# Patient Record
Sex: Male | Born: 1980 | Race: White | Hispanic: No | Marital: Married | Smoking: Current every day smoker
Health system: Southern US, Community
[De-identification: ages and names within clinical notes are randomized; demographics above are authoritative.]

## PROBLEM LIST (undated history)

## (undated) HISTORY — PX: FACIAL COSMETIC SURGERY: SHX629

---
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
## 2001-09-02 ENCOUNTER — Ambulatory Visit (HOSPITAL_COMMUNITY): Admission: RE | Admit: 2001-09-02 | Discharge: 2001-09-02 | Payer: Self-pay

## 2002-04-28 ENCOUNTER — Emergency Department (HOSPITAL_COMMUNITY): Admission: EM | Admit: 2002-04-28 | Discharge: 2002-04-28 | Payer: Self-pay | Admitting: Emergency Medicine

## 2002-08-25 ENCOUNTER — Emergency Department (HOSPITAL_COMMUNITY): Admission: EM | Admit: 2002-08-25 | Discharge: 2002-08-25 | Payer: Self-pay | Admitting: Emergency Medicine

## 2005-09-04 ENCOUNTER — Emergency Department (HOSPITAL_COMMUNITY): Admission: EM | Admit: 2005-09-04 | Discharge: 2005-09-04 | Payer: Self-pay | Admitting: Emergency Medicine

## 2005-10-16 ENCOUNTER — Emergency Department (HOSPITAL_COMMUNITY): Admission: EM | Admit: 2005-10-16 | Discharge: 2005-10-16 | Payer: Self-pay | Admitting: *Deleted

## 2013-04-06 ENCOUNTER — Emergency Department (HOSPITAL_COMMUNITY)
Admission: EM | Admit: 2013-04-06 | Discharge: 2013-04-06 | Disposition: A | Discharge status: Home / Self Care | Payer: Self-pay | Attending: Emergency Medicine | Admitting: Emergency Medicine

## 2013-04-06 ENCOUNTER — Encounter (HOSPITAL_COMMUNITY): Payer: Self-pay | Admitting: *Deleted

## 2013-04-06 DIAGNOSIS — F17200 Nicotine dependence, unspecified, uncomplicated: Secondary | ICD-10-CM | POA: Insufficient documentation

## 2013-04-06 DIAGNOSIS — IMO0002 Reserved for concepts with insufficient information to code with codable children: Secondary | ICD-10-CM | POA: Insufficient documentation

## 2013-04-06 DIAGNOSIS — R21 Rash and other nonspecific skin eruption: Secondary | ICD-10-CM | POA: Insufficient documentation

## 2013-04-06 DIAGNOSIS — Z79899 Other long term (current) drug therapy: Secondary | ICD-10-CM | POA: Insufficient documentation

## 2013-04-06 LAB — CBC
MCH: 31.6 pg (ref 26.0–34.0)
MCV: 88.3 fL (ref 78.0–100.0)
Platelets: 256 10*3/uL (ref 150–400)
RBC: 4.78 MIL/uL (ref 4.22–5.81)

## 2013-04-06 LAB — RPR: RPR Ser Ql: NONREACTIVE

## 2013-04-06 NOTE — ED Notes (Signed)
Pt states he just got back from [place] from vacation on Sunday. Pt states since Sunday he has been having bil hand and feet swelling, burning, and pain.

## 2013-04-07 ENCOUNTER — Emergency Department (HOSPITAL_COMMUNITY)
Admission: EM | Admit: 2013-04-07 | Discharge: 2013-04-07 | Disposition: A | Discharge status: Home / Self Care | Payer: Self-pay | Attending: Emergency Medicine | Admitting: Emergency Medicine

## 2013-04-07 ENCOUNTER — Encounter (HOSPITAL_COMMUNITY): Payer: Self-pay | Admitting: *Deleted

## 2013-04-07 DIAGNOSIS — F17200 Nicotine dependence, unspecified, uncomplicated: Secondary | ICD-10-CM | POA: Insufficient documentation

## 2013-04-07 DIAGNOSIS — R21 Rash and other nonspecific skin eruption: Secondary | ICD-10-CM | POA: Insufficient documentation

## 2013-04-07 MED ORDER — PREDNISONE 20 MG PO TABS: ORAL_TABLET | ORAL | Status: DC

## 2013-04-07 MED ORDER — DOXYCYCLINE HYCLATE 100 MG PO CAPS: 100.0000 mg | ORAL_CAPSULE | Freq: Two times a day (BID) | ORAL | Status: DC

## 2013-04-07 MED ORDER — HYDROCODONE-ACETAMINOPHEN 5-325 MG PO TABS: 2.0000 | ORAL_TABLET | ORAL | Status: DC | PRN

## 2013-04-07 NOTE — ED Notes (Signed)
Pt c/o foot pain/palm pain x 2 days; states today burning; worse

## 2013-04-07 NOTE — ED Provider Notes (Signed)
CSN: [id number]     Arrival date & time 04/07/13  0058 History     First MD Initiated Contact with Patient 04/07/13 0116     Chief Complaint  Patient presents with  . Foot Pain   (Consider location/radiation/quality/duration/timing/severity/associated sxs/prior Treatment) HPI Comments: And presents to the ER for evaluation of continued foot and hand pain. Patient said yesterday for evaluation of painful rash on his palms and soles. Patient reports that the Benadryl is not helping. He thinks that his feet are more swollen. Still having trouble walking because of the pain. Patient still has not had a fever, but has been taking ibuprofen every 6-8 hours for the pain. No other cold or flu symptoms.  Patient is a 32 y.o. male presenting with lower extremity pain.  Foot Pain    History reviewed. No pertinent past medical history. Past Surgical History  Procedure Laterality Date  . Facial cosmetic surgery     No family history on file. History  Substance Use Topics  . Smoking status: Current Every Day Smoker    Types: Cigarettes  . Smokeless tobacco: Never Used  . Alcohol Use: Yes    Review of Systems  Constitutional: Negative for fever.  HENT: Negative for neck stiffness.   Skin: Positive for rash.  All other systems reviewed and are negative.    Allergies  Review of patient's allergies indicates no known allergies.  Home Medications   Current Outpatient Rx  Name  Route  Sig  Dispense  Refill  . loratadine (CLARITIN) 10 MG tablet   Oral   Take 10 mg by mouth as needed for allergies (allergies).          BP 157/82  Pulse 84  Temp(Src) 98.6 F (37 C)  Resp 20  SpO2 99% Physical Exam  Constitutional: He is oriented to person, place, and time. He appears well-developed and well-nourished. No distress.  HENT:  Head: Normocephalic and atraumatic.  Right Ear: Hearing normal.  Left Ear: Hearing normal.  Nose: Nose normal.  Mouth/Throat: Oropharynx is clear and  moist and mucous membranes are normal.  Eyes: Conjunctivae and EOM are normal. Pupils are equal, round, and reactive to light.  Neck: Normal range of motion. Neck supple.  Cardiovascular: Regular rhythm, S1 normal and S2 normal.  Exam reveals no gallop and no friction rub.   No murmur heard. Pulmonary/Chest: Effort normal and breath sounds normal. No respiratory distress. He exhibits no tenderness.  Abdominal: Soft. Normal appearance and bowel sounds are normal. There is no hepatosplenomegaly. There is no tenderness. There is no rebound, no guarding, no tenderness at McBurney's point and negative Murphy's sign. No hernia.  Musculoskeletal: Normal range of motion.  Neurological: He is alert and oriented to person, place, and time. He has normal strength. No cranial nerve deficit or sensory deficit. Coordination normal. GCS eye subscore is 4. GCS verbal subscore is 5. GCS motor subscore is 6.  Skin: Skin is warm, dry and intact. Rash (erythematous, nonraised, tender rash on palms and soles. Nonblanching, but not consistent with petechiae or purpura. No rash elsewhere on the body.) noted. No cyanosis.  Psychiatric: He has a normal mood and affect. His speech is normal and behavior is normal. Thought content normal.    ED Course   Procedures (including critical care time)  Labs Reviewed - No data to display No results found.  Diagnosis: Rash  MDM  Patient returns for evaluation of rash. He has not had any spreading of the rash, but  is complaining of continued pain. Rash is not petechial or purpuric.  It does appear to be consistent with hand-foot-mouth disease, unusual in adult population, but possible. Rash is not spreading and therefore documented by fever is unlikely. Patient does, however, report that he has had some recent tick bites this summer. Patient will be initiated on doxycycline for coverage of rickettsial disease as well as skin coverage for bacterial infection. Hydrocodone for  pain.  Gilda Crease, MD 04/07/13 [phone]

## 2013-04-07 NOTE — ED Notes (Signed)
MD at bedside. 

## 2013-04-16 NOTE — ED Provider Notes (Signed)
CSN: [id number]     Arrival date & time 04/06/13  0155 History   First MD Initiated Contact with Patient 04/06/13 0204     Chief Complaint  Patient presents with  . Hand Problem  . Foot Pain   (Consider location/radiation/quality/duration/timing/severity/associated sxs/prior Treatment) Patient is a 32 y.o. male presenting with lower extremity pain.  Foot Pain This is a new problem. Associated symptoms include a rash. Pertinent negatives include no chills, fever, headaches, nausea, neck pain, sore throat or vomiting. Associated symptoms comments: Painful rash to hands and feet that is described as burning type pain. No fever, rash other than on hands and feet, N, V or headache. .    History reviewed. No pertinent past medical history. Past Surgical History  Procedure Laterality Date  . Facial cosmetic surgery     History reviewed. No pertinent family history. History  Substance Use Topics  . Smoking status: Current Every Day Smoker    Types: Cigarettes  . Smokeless tobacco: Never Used  . Alcohol Use: Yes    Review of Systems  Constitutional: Negative for fever and chills.  HENT: Negative.  Negative for sore throat and neck pain.   Gastrointestinal: Negative.  Negative for nausea and vomiting.  Musculoskeletal:       See HPI.  Skin: Positive for rash.  Neurological: Negative.  Negative for headaches.    Allergies  Review of patient's allergies indicates no known allergies.  Home Medications   Current Outpatient Rx  Name  Route  Sig  Dispense  Refill  . loratadine (CLARITIN) 10 MG tablet   Oral   Take 10 mg by mouth as needed for allergies (allergies).         Marland Kitchen doxycycline (VIBRAMYCIN) 100 MG capsule   Oral   Take 1 capsule (100 mg total) by mouth 2 (two) times daily.   20 capsule   0   . HYDROcodone-acetaminophen (NORCO/VICODIN) 5-325 MG per tablet   Oral   Take 2 tablets by mouth every 4 (four) hours as needed for pain.   10 tablet   0   . predniSONE  (DELTASONE) 20 MG tablet      3 tabs po daily x 3 days, then 2 tabs x 3 days, then 1.5 tabs x 3 days, then 1 tab x 3 days, then 0.5 tabs x 3 days   27 tablet   0    BP 127/77  Pulse 89  Temp(Src) 99 F (37.2 C) (Oral)  Resp 18  Ht 5\' 10"  (1.778 m)  Wt 215 lb (97.523 kg)  BMI 30.85 kg/m2  SpO2 98% Physical Exam  Constitutional: He is oriented to person, place, and time. He appears well-developed and well-nourished.  Neck: Normal range of motion.  Pulmonary/Chest: Effort normal.  Musculoskeletal: Normal range of motion.  Neurological: He is alert and oriented to person, place, and time.  Skin: Skin is warm and dry.  Nonraised erythematous non-nodular areas that are mildly tender located on palms and soles. No noted swelling. FROM.  Psychiatric: He has a normal mood and affect.    ED Course  Procedures (including critical care time) Labs Review Labs Reviewed  RPR  CBC   Imaging Review No results found.  MDM   1. Rash and nonspecific skin eruption    No fever, vomiting or headache - doubt tick borne. Discussed with Dr. Blinda Leatherwood. Will treat with Benadryl and recommend PCP follow up.    Arnoldo Hooker, PA-C 04/16/13 2243

## 2013-04-19 NOTE — ED Provider Notes (Signed)
Medical screening examination/treatment/procedure(s) were conducted as a shared visit with non-physician practitioner(s) and myself.  I personally evaluated the patient during the encounter  PAtient with maculopaplar, non-specific rash on hands and feet, no fever. Etiology unknown, treat symptomatically.  Gilda Crease, MD 04/19/13 [phone]

## 2013-10-20 ENCOUNTER — Emergency Department (HOSPITAL_COMMUNITY)
Admission: EM | Admit: 2013-10-20 | Discharge: 2013-10-20 | Disposition: A | Discharge status: Home / Self Care | Payer: Self-pay | Attending: Emergency Medicine | Admitting: Emergency Medicine

## 2013-10-20 ENCOUNTER — Encounter (HOSPITAL_COMMUNITY): Payer: Self-pay | Admitting: Emergency Medicine

## 2013-10-20 ENCOUNTER — Emergency Department (HOSPITAL_COMMUNITY): Payer: Self-pay

## 2013-10-20 DIAGNOSIS — F17200 Nicotine dependence, unspecified, uncomplicated: Secondary | ICD-10-CM | POA: Insufficient documentation

## 2013-10-20 DIAGNOSIS — IMO0002 Reserved for concepts with insufficient information to code with codable children: Secondary | ICD-10-CM | POA: Insufficient documentation

## 2013-10-20 DIAGNOSIS — M779 Enthesopathy, unspecified: Secondary | ICD-10-CM

## 2013-10-20 DIAGNOSIS — M65849 Other synovitis and tenosynovitis, unspecified hand: Principal | ICD-10-CM

## 2013-10-20 DIAGNOSIS — Z79899 Other long term (current) drug therapy: Secondary | ICD-10-CM | POA: Insufficient documentation

## 2013-10-20 DIAGNOSIS — Z792 Long term (current) use of antibiotics: Secondary | ICD-10-CM | POA: Insufficient documentation

## 2013-10-20 DIAGNOSIS — M65839 Other synovitis and tenosynovitis, unspecified forearm: Secondary | ICD-10-CM | POA: Insufficient documentation

## 2013-10-20 MED ORDER — HYDROCODONE-ACETAMINOPHEN 5-325 MG PO TABS: 1.0000 | ORAL_TABLET | ORAL | Status: DC | PRN

## 2013-10-20 NOTE — ED Notes (Addendum)
Left wrist pain x 2 moths now all day pain cannot lift things w/ hand  States just woke up that way no injuryhas good pulse good blanch can wiggle fingers

## 2013-10-20 NOTE — ED Provider Notes (Signed)
Medical screening examination/treatment/procedure(s) were performed by non-physician practitioner and as supervising physician I was immediately available for consultation/collaboration.   EKG Interpretation None      Devoria AlbeIva Cartier Washko, MD, Armando GangFACEP   Ward GivensIva L Brei Pociask, MD 10/20/13 1550

## 2013-10-20 NOTE — ED Provider Notes (Signed)
CSN: [id number]     Arrival date & time 10/20/13  1353 History  This chart was scribed for non-physician practitioner Teressa LowerVrinda Kali Ambler, NP working with Ward GivensIva L Knapp, MD by Dorothey Basemania Sutton, ED Scribe. This patient was seen in room TR06C/TR06C and the patient's care was started at 2:37 PM.    Chief Complaint  Patient presents with  . Wrist Pain   The history is provided by the patient. No language interpreter was used.   HPI Comments: Colin Adkins is a 33 y.o. male who presents to the Emergency Department complaining of an intermittent pain to the left wrist that he states has been ongoing for the past 2 months, but has been progressively worsening to a constant pain over the last few days. He states that the pain is exacerbated with lifting heavy objects and movement. He denies any potential injury or trauma to the area. Patient reports wearing a brace on the area at night without significant relief. Patient states that he works in [employer] ([employer]). He denies history of prior injury to the area. Patient has no other pertinent medical history.   History reviewed. No pertinent past medical history. Past Surgical History  Procedure Laterality Date  . Facial cosmetic surgery     No family history on file. History  Substance Use Topics  . Smoking status: Current Every Day Smoker    Types: Cigarettes  . Smokeless tobacco: Never Used  . Alcohol Use: Yes    Review of Systems  Musculoskeletal: Positive for arthralgias.  All other systems reviewed and are negative.    Allergies  Review of patient's allergies indicates no known allergies.  Home Medications   Current Outpatient Rx  Name  Route  Sig  Dispense  Refill  . doxycycline (VIBRAMYCIN) 100 MG capsule   Oral   Take 1 capsule (100 mg total) by mouth 2 (two) times daily.   20 capsule   0   . HYDROcodone-acetaminophen (NORCO/VICODIN) 5-325 MG per tablet   Oral   Take 2 tablets by mouth every 4 (four) hours as needed for pain.  10 tablet   0   . loratadine (CLARITIN) 10 MG tablet   Oral   Take 10 mg by mouth as needed for allergies (allergies).         . predniSONE (DELTASONE) 20 MG tablet      3 tabs po daily x 3 days, then 2 tabs x 3 days, then 1.5 tabs x 3 days, then 1 tab x 3 days, then 0.5 tabs x 3 days   27 tablet   0    Triage Vitals: BP 134/97  Pulse 99  Temp(Src) 98.2 F (36.8 C) (Oral)  Resp 16  SpO2 99%  Physical Exam  Nursing note and vitals reviewed. Constitutional: He is oriented to person, place, and time. He appears well-developed and well-nourished. No distress.  HENT:  Head: Normocephalic and atraumatic.  Eyes: Conjunctivae are normal.  Neck: Normal range of motion. Neck supple.  Cardiovascular: Normal rate, regular rhythm and normal heart sounds.   Pulmonary/Chest: Effort normal and breath sounds normal. No respiratory distress.  Abdominal: He exhibits no distension.  Musculoskeletal: Normal range of motion.  Tenderness to palpation to the medial aspect of the left wrist. Full range of motion.   Neurological: He is alert and oriented to person, place, and time.  Skin: Skin is warm and dry.  Psychiatric: He has a normal mood and affect. His behavior is normal.    ED  Course  Procedures (including critical care time)  DIAGNOSTIC STUDIES: Oxygen Saturation is 99% on room air, normal by my interpretation.    COORDINATION OF CARE: 2:40 PM- Ordered an x-ray of the left wrist. Discussed treatment plan with patient at bedside and patient verbalized agreement.     Labs Review Labs Reviewed - No data to display  Imaging Review Dg Wrist Complete Left  10/20/2013   CLINICAL DATA:  Lateral left wrist pain  EXAM: LEFT WRIST - COMPLETE 3+ VIEW  COMPARISON:  None.  FINDINGS: There is no evidence of fracture or dislocation. There is no evidence of arthropathy or other focal bone abnormality. Soft tissues are unremarkable.  IMPRESSION: Negative.   Electronically Signed   By: Elige Ko   On: 10/20/2013 15:21     EKG Interpretation None      MDM   Final diagnoses:  Tendonitis    Pt has a brace at home and he is instructed on use. Pt okay to follow up with hand for continued syptoms  I personally performed the services described in this documentation, which was scribed in my presence. The recorded information has been reviewed and is accurate.     Teressa Lower, NP 10/20/13 1538

## 2013-10-20 NOTE — Discharge Instructions (Signed)
As discussed you need to where your brace. Tendinitis Tendinitis is swelling and inflammation of the tendons. Tendons are band-like tissues that connect muscle to bone. Tendinitis commonly occurs in the:   Shoulders (rotator cuff).  Heels (Achilles tendon).  Elbows (triceps tendon). CAUSES Tendinitis is usually caused by overusing the tendon, muscles, and joints involved. When the tissue surrounding a tendon (synovium) becomes inflamed, it is called tenosynovitis. Tendinitis commonly develops in people whose jobs require repetitive motions. SYMPTOMS  Pain.  Tenderness.  Mild swelling. DIAGNOSIS Tendinitis is usually diagnosed by physical exam. Your caregiver may also order X-rays or other imaging tests. TREATMENT Your caregiver may recommend certain medicines or exercises for your treatment. HOME CARE INSTRUCTIONS   Use a sling or splint for as long as directed by your caregiver until the pain decreases.  Put ice on the injured area.  Put ice in a plastic bag.  Place a towel between your skin and the bag.  Leave the ice on for 15-20 minutes, 03-04 times a day.  Avoid using the limb while the tendon is painful. Perform gentle range of motion exercises only as directed by your caregiver. Stop exercises if pain or discomfort increase, unless directed otherwise by your caregiver.  Only take over-the-counter or prescription medicines for pain, discomfort, or fever as directed by your caregiver. SEEK MEDICAL CARE IF:   Your pain and swelling increase.  You develop new, unexplained symptoms, especially increased numbness in the hands. MAKE SURE YOU:   Understand these instructions.  Will watch your condition.  Will get help right away if you are not doing well or get worse. Document Released: 08/02/2000 Document Revised: 10/28/2011 Document Reviewed: 10/22/2010 [facility] Patient Information 2014 [place] [employer], [place].

## 2015-06-13 IMAGING — CR DG WRIST COMPLETE 3+V*L*
4 series · 4 of 4 positions shown · non-contrast
Comparison: None.

CLINICAL DATA: Lateral left wrist pain

EXAM:
LEFT WRIST - COMPLETE 3+ VIEW

[x wrist pa left]
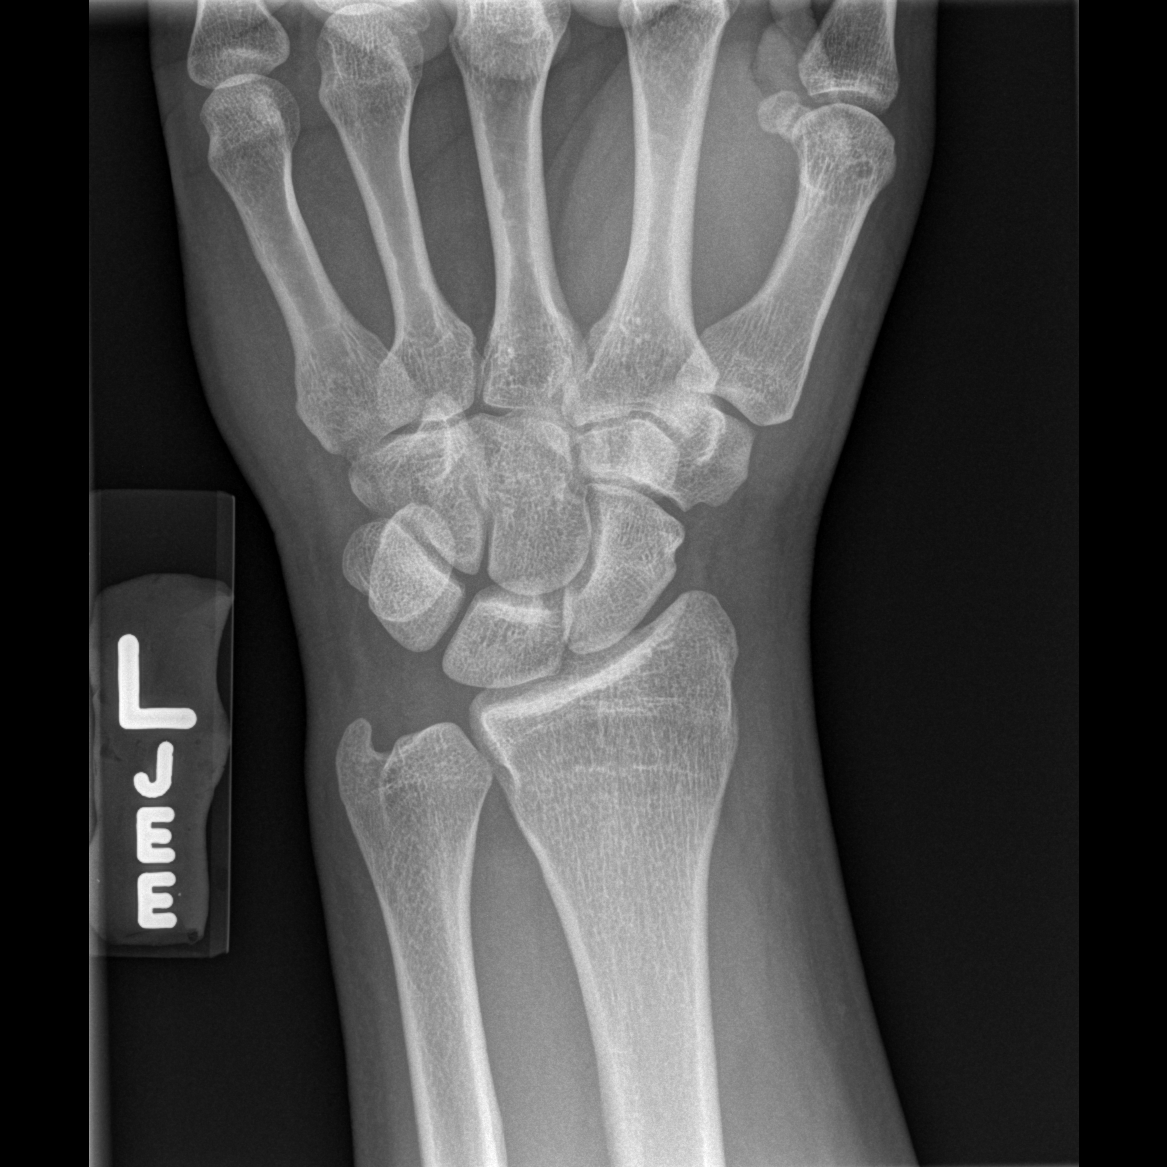

[x wrist obl left]
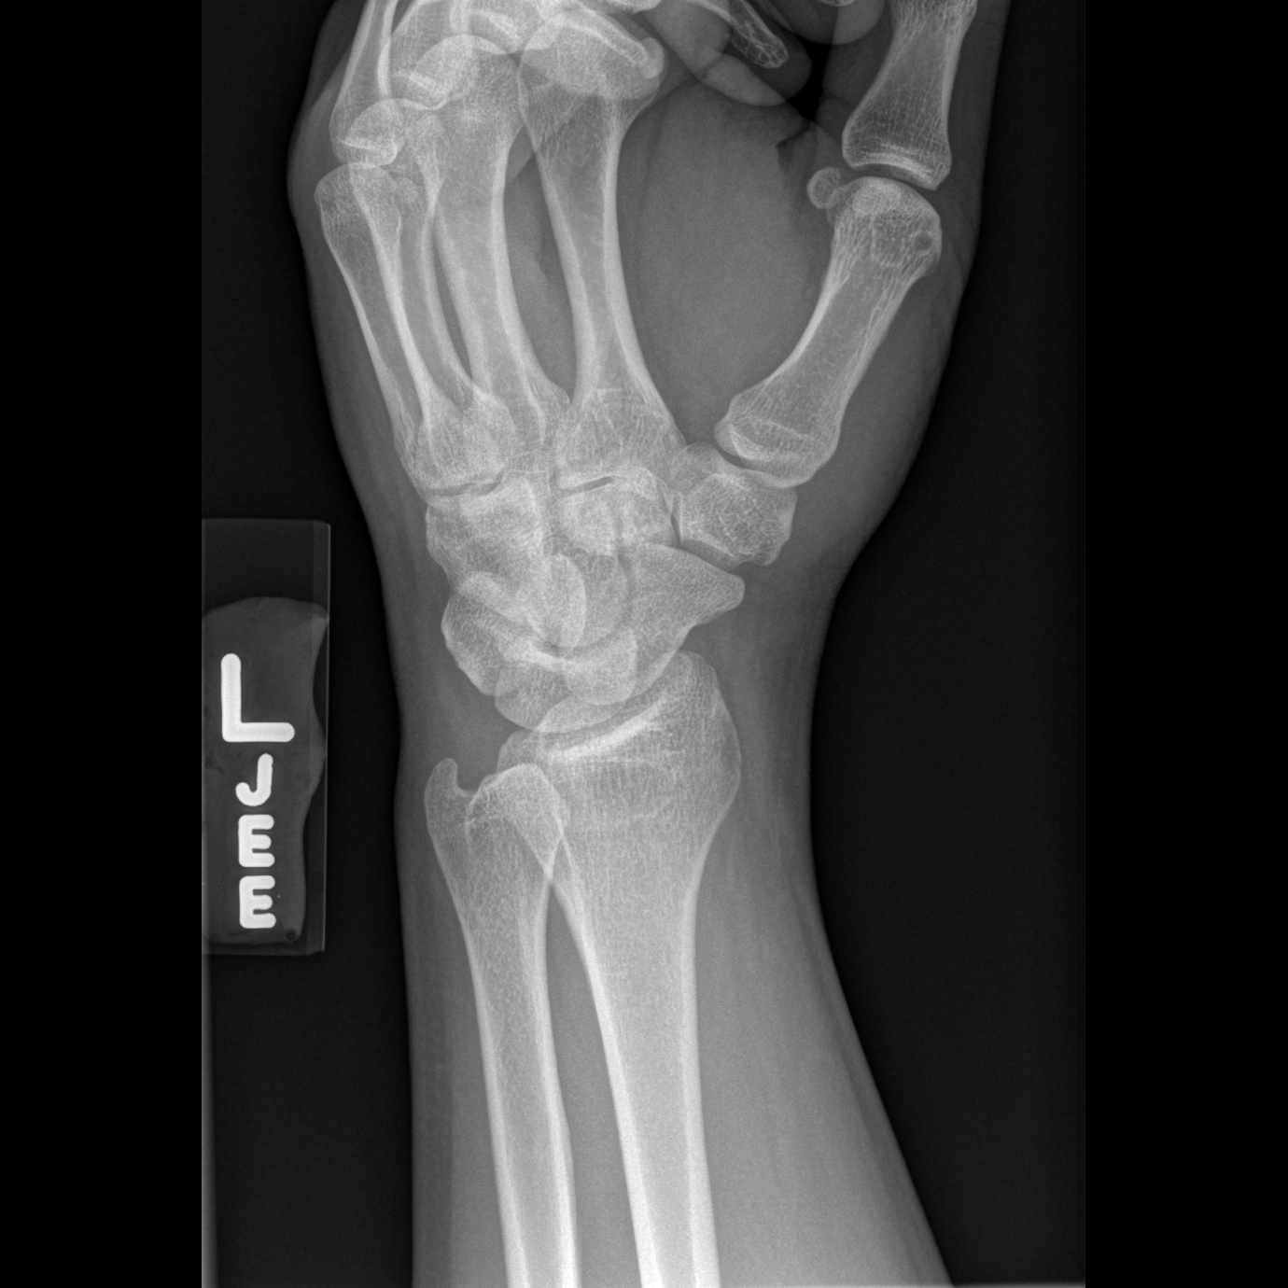

[x wrist lat left]
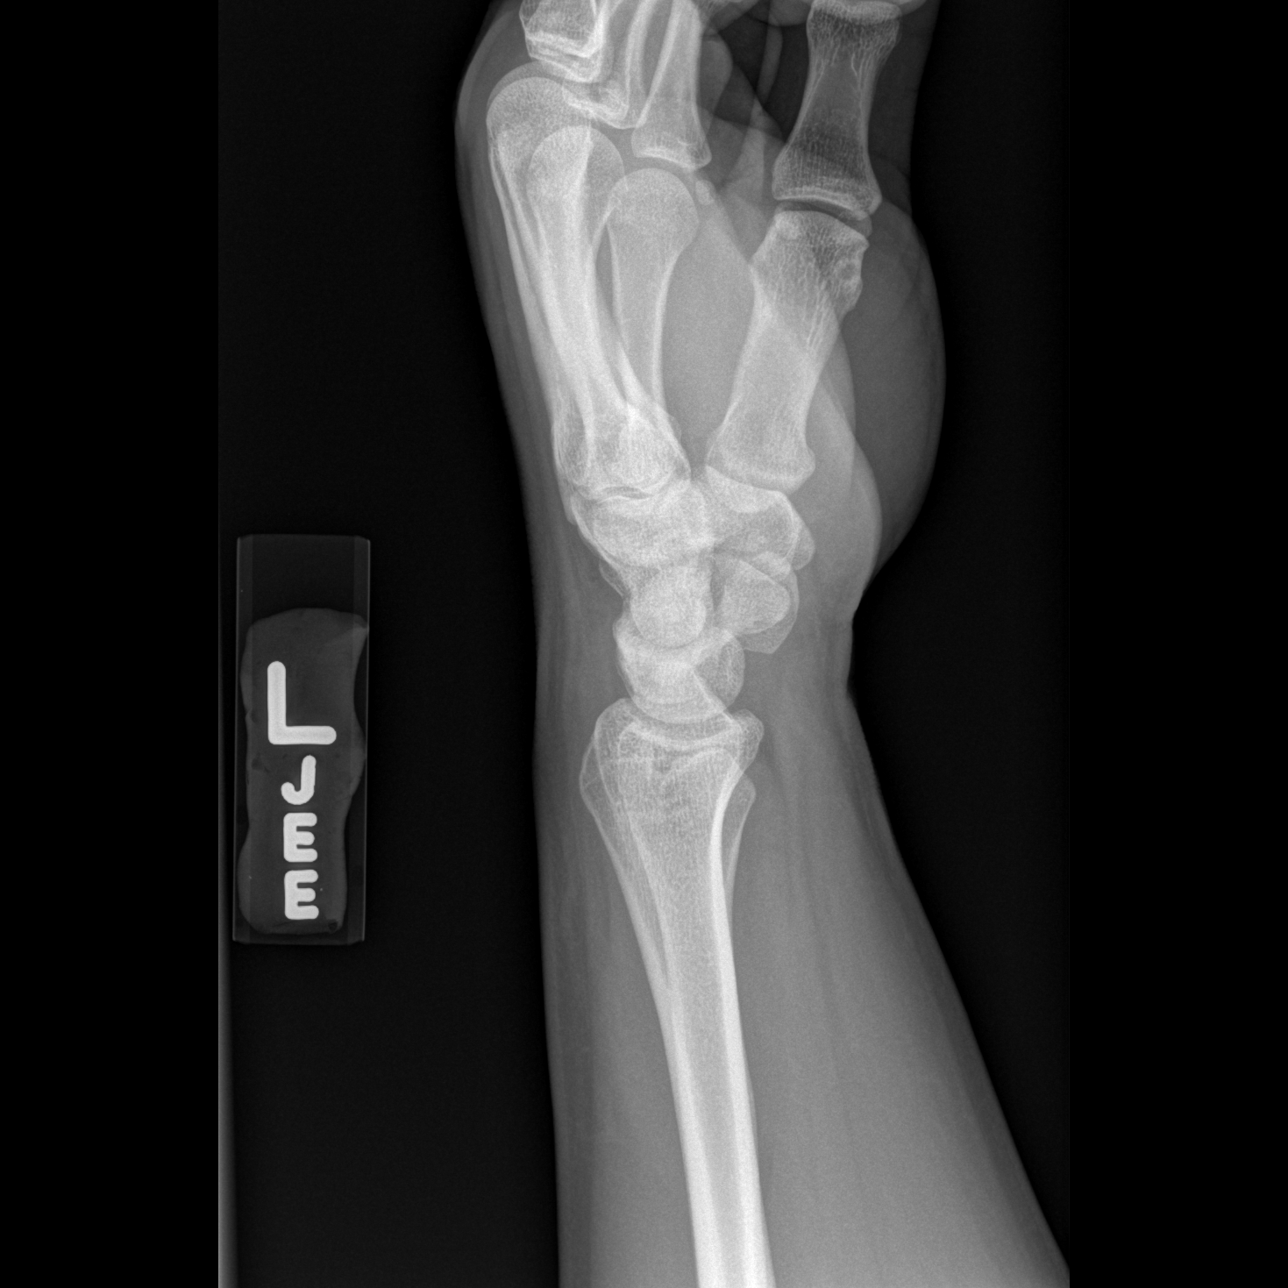

[x navicular]
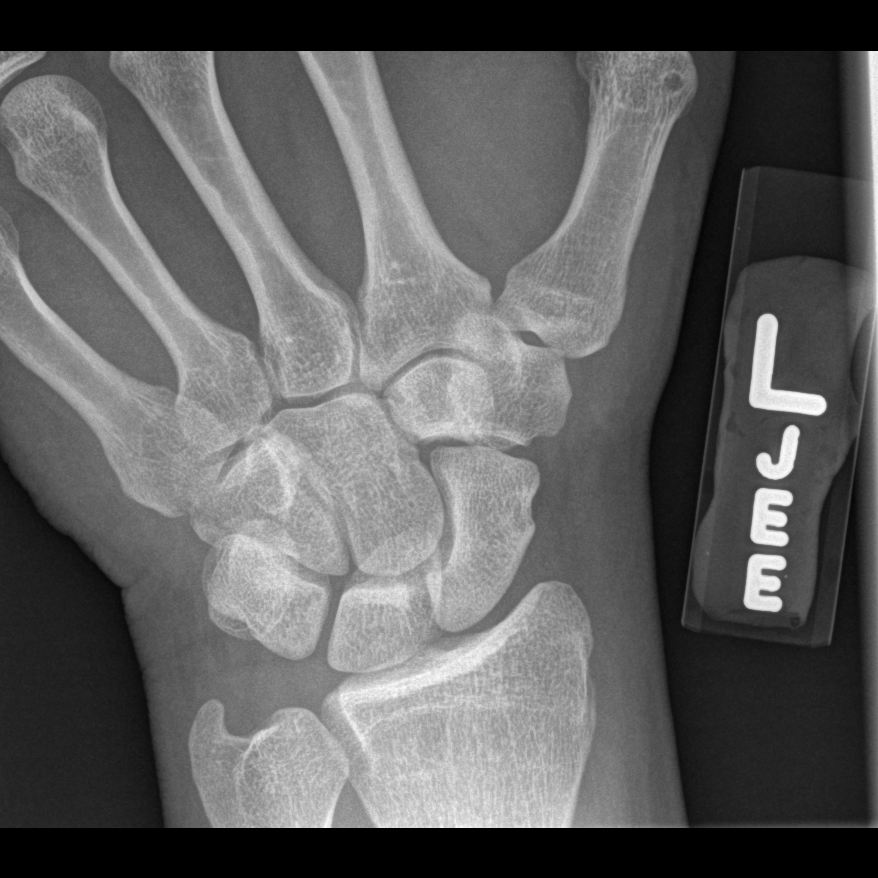

[4 of 4 positions shown; findings below may reference images not displayed]

FINDINGS: There is no evidence of fracture or dislocation. There is no
evidence of arthropathy or other focal bone abnormality. Soft
tissues are unremarkable.
IMPRESSION: Negative.

## 2015-06-27 ENCOUNTER — Ambulatory Visit (INDEPENDENT_AMBULATORY_CARE_PROVIDER_SITE_OTHER): Payer: Self-pay | Admitting: Physician Assistant

## 2015-06-27 VITALS — BP 136/80 | HR 85 | Temp 98.2°F | Resp 18 | Ht 71.0 in | Wt 219.2 lb

## 2015-06-27 DIAGNOSIS — J069 Acute upper respiratory infection, unspecified: Secondary | ICD-10-CM

## 2015-06-27 MED ORDER — IPRATROPIUM BROMIDE 0.03 % NA SOLN
2.0000 | Freq: Two times a day (BID) | NASAL | Status: DC
Start: 2015-06-27 — End: 2022-10-02

## 2015-06-27 MED ORDER — BENZONATATE 100 MG PO CAPS: 100.0000 mg | ORAL_CAPSULE | Freq: Three times a day (TID) | ORAL | Status: DC | PRN

## 2015-06-27 NOTE — Patient Instructions (Signed)
Upper Respiratory Infection, Adult Most upper respiratory infections (URIs) are a viral infection of the air passages leading to the lungs. A URI affects the nose, throat, and upper air passages. The most common type of URI is nasopharyngitis and is typically referred to as "the common cold." URIs run their course and usually go away on their own. Most of the time, a URI does not require medical attention, but sometimes a bacterial infection in the upper airways can follow a viral infection. This is called a secondary infection. Sinus and middle ear infections are common types of secondary upper respiratory infections. Bacterial pneumonia can also complicate a URI. A URI can worsen asthma and chronic obstructive pulmonary disease (COPD). Sometimes, these complications can require emergency medical care and may be life threatening.  CAUSES Almost all URIs are caused by viruses. A virus is a type of germ and can spread from one person to another.  RISKS FACTORS You may be at risk for a URI if:   You smoke.   You have chronic heart or lung disease.  You have a weakened defense (immune) system.   You are very young or very old.   You have nasal allergies or asthma.  You work in crowded or poorly ventilated areas.  You work in health care facilities or schools. SIGNS AND SYMPTOMS  Symptoms typically develop 2-3 days after you come in contact with a cold virus. Most viral URIs last 7-10 days. However, viral URIs from the influenza virus (flu virus) can last 14-18 days and are typically more severe. Symptoms may include:   Runny or stuffy (congested) nose.   Sneezing.   Cough.   Sore throat.   Headache.   Fatigue.   Fever.   Loss of appetite.   Pain in your forehead, behind your eyes, and over your cheekbones (sinus pain).  Muscle aches.  DIAGNOSIS  Your health care provider may diagnose a URI by:  Physical exam.  Tests to check that your symptoms are not due to  another condition such as:  Strep throat.  Sinusitis.  Pneumonia.  Asthma. TREATMENT  A URI goes away on its own with time. It cannot be cured with medicines, but medicines may be prescribed or recommended to relieve symptoms. Medicines may help:  Reduce your fever.  Reduce your cough.  Relieve nasal congestion. HOME CARE INSTRUCTIONS   Take medicines only as directed by your health care provider.   Gargle warm saltwater or take cough drops to comfort your throat as directed by your health care provider.  Use a warm mist humidifier or inhale steam from a shower to increase air moisture. This may make it easier to breathe.  Drink enough fluid to keep your urine clear or pale yellow.   Eat soups and other clear broths and maintain good nutrition.   Rest as needed.   Return to work when your temperature has returned to normal or as your health care provider advises. You may need to stay home longer to avoid infecting others. You can also use a face mask and careful hand washing to prevent spread of the virus.  Increase the usage of your inhaler if you have asthma.   Do not use any tobacco products, including cigarettes, chewing tobacco, or electronic cigarettes. If you need help quitting, ask your health care provider. PREVENTION  The best way to protect yourself from getting a cold is to practice good hygiene.   Avoid oral or hand contact with people with cold   symptoms.   Wash your hands often if contact occurs.  There is no clear evidence that vitamin C, vitamin E, echinacea, or exercise reduces the chance of developing a cold. However, it is always recommended to get plenty of rest, exercise, and practice good nutrition.  SEEK MEDICAL CARE IF:   You are getting worse rather than better.   Your symptoms are not controlled by medicine.   You have chills.  You have worsening shortness of breath.  You have brown or red mucus.  You have yellow or brown nasal  discharge.  You have pain in your face, especially when you bend forward.  You have a fever.  You have swollen neck glands.  You have pain while swallowing.  You have white areas in the back of your throat. SEEK IMMEDIATE MEDICAL CARE IF:   You have severe or persistent:  Headache.  Ear pain.  Sinus pain.  Chest pain.  You have chronic lung disease and any of the following:  Wheezing.  Prolonged cough.  Coughing up blood.  A change in your usual mucus.  You have a stiff neck.  You have changes in your:  Vision.  Hearing.  Thinking.  Mood. MAKE SURE YOU:   Understand these instructions.  Will watch your condition.  Will get help right away if you are not doing well or get worse.   This information is not intended to replace advice given to you by your health care provider. Make sure you discuss any questions you have with your health care provider.   Document Released: 01/29/2001 Document Revised: 12/20/2014 Document Reviewed: 11/10/2013 Elsevier Interactive Patient Education 2016 [employer]  

## 2015-06-27 NOTE — Progress Notes (Signed)
   Subjective:    Patient ID: Colin Adkins, male    DOB: Aug 14, 1981, 34 y.o.   MRN: [id number]  HPI Patient presents for cough and congestion that have been present for the past 6 days. Has been smoking for the past 15 years and always has a cough which is bad in the morning, however, this morning a little worse and saw blood when he coughed. Admits that had a vessel in right nares that has to be cauterized often, but has not had done recently. Endorses rhinorrhea, HA, and some sinus pressure. Denies fever, N/V, SOB, wheezing, CP, post nasal drip. Had taken ibuprofen with some relief. No know sick contacts. NKDA.   Review of Systems As noted above.    Objective:   Physical Exam  Constitutional: He is oriented to person, place, and time. He appears well-developed and well-nourished. No distress.  Blood pressure 136/80, pulse 85, temperature 98.2 F (36.8 C), temperature source Oral, resp. rate 18, height 5\' 11"  (1.803 m), weight 219 lb 3.2 oz (99.428 kg), SpO2 98 %.   HENT:  Head: Normocephalic and atraumatic.  Right Ear: Tympanic membrane, external ear and ear canal normal.  Left Ear: Tympanic membrane, external ear and ear canal normal.  Nose: Rhinorrhea (with erythema) and nasal deformity present. No nose lacerations, sinus tenderness or nasal septal hematoma. No epistaxis.  No foreign bodies. Right sinus exhibits no maxillary sinus tenderness and no frontal sinus tenderness. Left sinus exhibits no maxillary sinus tenderness and no frontal sinus tenderness.  Mouth/Throat: Uvula is midline, oropharynx is clear and moist and mucous membranes are normal. No oropharyngeal exudate, posterior oropharyngeal edema or posterior oropharyngeal erythema.  Dried blood in left nares, opposite side that usually affects patient  Eyes: Conjunctivae are normal. Pupils are equal, round, and reactive to light. Right eye exhibits no discharge. Left eye exhibits no discharge. No scleral icterus.  Neck: Normal  range of motion. Neck supple. No thyromegaly present.  Cardiovascular: Normal rate, regular rhythm and normal heart sounds.  Exam reveals no gallop and no friction rub.   No murmur heard. Pulmonary/Chest: Effort normal and breath sounds normal. No respiratory distress. He has no decreased breath sounds. He has no wheezes. He has no rhonchi. He has no rales.  Abdominal: Soft. Bowel sounds are normal. He exhibits no distension. There is no tenderness. There is no rebound and no guarding.  Lymphadenopathy:    He has no cervical adenopathy.  Neurological: He is alert and oriented to person, place, and time.  Skin: Skin is warm and dry. No rash noted. He is not diaphoretic. No erythema.       Assessment & Plan:  1. Acute upper respiratory infection Plenty of fluid.  - ipratropium (ATROVENT) 0.03 % nasal spray; Place 2 sprays into both nostrils 2 (two) times daily.  Dispense: 30 mL; Refill: 0 - benzonatate (TESSALON) 100 MG capsule; Take 1-2 capsules (100-200 mg total) by mouth 3 (three) times daily as needed for cough.  Dispense: 40 capsule; Refill: 0   Ranada Vigorito PA-C  [facility] 06/27/2015 2:48 PM

## 2018-04-09 ENCOUNTER — Other Ambulatory Visit: Payer: Self-pay

## 2018-04-09 ENCOUNTER — Encounter (HOSPITAL_COMMUNITY): Payer: Self-pay | Admitting: Emergency Medicine

## 2018-04-09 ENCOUNTER — Emergency Department (HOSPITAL_COMMUNITY)
Admission: EM | Admit: 2018-04-09 | Discharge: 2018-04-10 | Disposition: A | Discharge status: Home / Self Care | Payer: Medicaid Other | Attending: Emergency Medicine | Admitting: Emergency Medicine

## 2018-04-09 DIAGNOSIS — M79602 Pain in left arm: Secondary | ICD-10-CM | POA: Diagnosis present

## 2018-04-09 DIAGNOSIS — F17210 Nicotine dependence, cigarettes, uncomplicated: Secondary | ICD-10-CM | POA: Insufficient documentation

## 2018-04-09 DIAGNOSIS — Z79899 Other long term (current) drug therapy: Secondary | ICD-10-CM | POA: Insufficient documentation

## 2018-04-09 DIAGNOSIS — G5622 Lesion of ulnar nerve, left upper limb: Secondary | ICD-10-CM

## 2018-04-09 MED ORDER — PREDNISONE 20 MG PO TABS
60.0000 mg | ORAL_TABLET | Freq: Once | ORAL | Status: AC
  Administered 2018-04-10: 60 mg via ORAL
  Filled 2018-04-09: qty 3

## 2018-04-09 NOTE — ED Triage Notes (Signed)
Pt states for the past 2.5-3 weeks he has had numbness and tingling in his left hand in the 4th and 5th fingers  Pt states over the past few days that feeling has started going up his arm and now he has a dull achy pain in his left elbow

## 2018-04-10 MED ORDER — PREDNISONE 20 MG PO TABS: ORAL_TABLET | ORAL | 0 refills | Status: DC

## 2018-04-10 NOTE — Discharge Instructions (Signed)
As we talked about today, you likely have some compression/entrapment of your ulnar nerve causing your symptoms. Take the prescribed medication as directed. Follow-up with hand surgery-- call Dr. Debby Budoley's office in the morning to make appt. Return to the ED for new or worsening symptoms.

## 2018-04-10 NOTE — ED Provider Notes (Signed)
[facility]-EMERGENCY DEPT Provider Note   CSN: [id number] Arrival date & time: 04/09/18  2223     History   Chief Complaint Chief Complaint  Patient presents with  . Arm Pain    HPI NYCHOLAS Adkins is a 37 y.o. male.  The history is provided by the patient and medical records.    37 year old male here with tingling in his left fourth and fifth fingers.  Reports this is been ongoing for about 2 weeks.  Reports over the past few days he has noticed some pain and sensation of tension in the inside of his left elbow.  This is not had any injury, trauma, or falls.  States he does not do a lot of manual labor and does not do repetitive motions during the day.  States elbow just feels a little "sore".  He has not had any difficulty with strength in his left arm and has not noticed any weakness.  Is able to grip and pick up items as normal.  He is right-hand dominant.  History reviewed. No pertinent past medical history.  There are no active problems to display for this patient.   Past Surgical History:  Procedure Laterality Date  . FACIAL COSMETIC SURGERY          Home Medications    Prior to Admission medications   Medication Sig Start Date End Date Taking? Authorizing Provider  benzonatate (TESSALON) 100 MG capsule Take 1-2 capsules (100-200 mg total) by mouth 3 (three) times daily as needed for cough. 06/27/15   Brewington, Tishira R, PA-C  doxycycline (VIBRAMYCIN) 100 MG capsule Take 1 capsule (100 mg total) by mouth 2 (two) times daily. Patient not taking: Reported on 06/27/2015 04/07/13   Gilda Crease, MD  HYDROcodone-acetaminophen (NORCO/VICODIN) 5-325 MG per tablet Take 2 tablets by mouth every 4 (four) hours as needed for pain. Patient not taking: Reported on 06/27/2015 04/07/13   Gilda Crease, MD  HYDROcodone-acetaminophen (NORCO/VICODIN) 5-325 MG per tablet Take 1-2 tablets by mouth every 4 (four) hours as needed. Patient not taking:  Reported on 06/27/2015 10/20/13   Teressa Lower, NP  ipratropium (ATROVENT) 0.03 % nasal spray Place 2 sprays into both nostrils 2 (two) times daily. 06/27/15   Brewington, Tishira R, PA-C  loratadine (CLARITIN) 10 MG tablet Take 10 mg by mouth as needed for allergies (allergies).    [provider]  predniSONE (DELTASONE) 20 MG tablet 3 tabs po daily x 3 days, then 2 tabs x 3 days, then 1.5 tabs x 3 days, then 1 tab x 3 days, then 0.5 tabs x 3 days Patient not taking: Reported on 06/27/2015 04/07/13   Gilda Crease, MD    Family History History reviewed. No pertinent family history.  Social History Social History   Tobacco Use  . Smoking status: Current Every Day Smoker    Types: Cigarettes  . Smokeless tobacco: Never Used  Substance Use Topics  . Alcohol use: Yes  . Drug use: No     Allergies   Patient has no known allergies.   Review of Systems Review of Systems  Musculoskeletal: Positive for arthralgias.  All other systems reviewed and are negative.    Physical Exam Updated Vital Signs BP (!) 156/99 (BP Location: Right Arm)   Pulse 78   Resp 15   Ht 5\' 11"  (1.803 m)   Wt 99.8 kg   SpO2 98%   BMI 30.68 kg/m   Physical Exam  Constitutional: He  is oriented to person, place, and time. He appears well-developed and well-nourished.  HENT:  Head: Normocephalic and atraumatic.  Mouth/Throat: Oropharynx is clear and moist.  Eyes: Pupils are equal, round, and reactive to light. Conjunctivae and EOM are normal.  Neck: Normal range of motion.  Cardiovascular: Normal rate, regular rhythm and normal heart sounds.  Pulmonary/Chest: Effort normal and breath sounds normal.  Abdominal: Soft. Bowel sounds are normal.  Musculoskeletal: Normal range of motion.  Left arms including elbow, wrist, and fingers all normal in appearance without visible swelling or deformity; with compression of medial epicondyle of left elbow increased paresthesias extending down the  arm; distal sensation of the left 4th and 5th fingers are diminished compared to rest of the hand, maintains normal grip strength, normal radial pulse and cap refill  Neurological: He is alert and oriented to person, place, and time.  Skin: Skin is warm and dry.  Psychiatric: He has a normal mood and affect.  Nursing note and vitals reviewed.    ED Treatments / Results  Labs (all labs ordered are listed, but only abnormal results are displayed) Labs Reviewed - No data to display  EKG None  Radiology No results found.  Procedures Procedures (including critical care time)  Medications Ordered in ED Medications  predniSONE (DELTASONE) tablet 60 mg (60 mg Oral Given 04/10/18 0026)     Initial Impression / Assessment and Plan / ED Course  I have reviewed the triage vital signs and the nursing notes.  Pertinent labs & imaging results that were available during my care of the patient were reviewed by me and considered in my medical decision making (see chart for details).  37 year old male here with tingling in his left fourth and fifth digits as well as some pain along the medial left elbow.  Exam findings as above.  He has no focal strength deficit maintains normal range of motion.  Sensation is somewhat diminished in the distal left fourth and fifth fingertips and palpation along the medial epicondyles makes this worse.  Patient likely with ulnar nerve compression/entrapment.  He does not have any focal deficit to suggest central cause such as CVA, TIA, or other central neurologic etiology.  Will start on steroids and refer to hand surgery for follow-up.  Discussed plan with patient, he acknowledged understanding and agreed with plan of care.  Return precautions given for new or worsening symptoms.  Final Clinical Impressions(s) / ED Diagnoses   Final diagnoses:  Ulnar nerve compression, left    ED Discharge Orders         Ordered    predniSONE (DELTASONE) 20 MG tablet      04/10/18 0003           Garlon HatchetSanders, Bayden Gil M, PA-C 04/10/18 0143    Molpus, Jonny RuizJohn, MD 04/10/18 [id number]

## 2018-05-05 ENCOUNTER — Encounter (HOSPITAL_COMMUNITY): Payer: Self-pay

## 2018-05-05 ENCOUNTER — Emergency Department (HOSPITAL_COMMUNITY)
Admission: EM | Admit: 2018-05-05 | Discharge: 2018-05-06 | Disposition: A | Discharge status: Home / Self Care | Payer: Medicaid Other | Attending: Emergency Medicine | Admitting: Emergency Medicine

## 2018-05-05 DIAGNOSIS — Z79899 Other long term (current) drug therapy: Secondary | ICD-10-CM | POA: Insufficient documentation

## 2018-05-05 DIAGNOSIS — F17210 Nicotine dependence, cigarettes, uncomplicated: Secondary | ICD-10-CM | POA: Diagnosis not present

## 2018-05-05 DIAGNOSIS — H5789 Other specified disorders of eye and adnexa: Secondary | ICD-10-CM | POA: Diagnosis present

## 2018-05-05 DIAGNOSIS — H1033 Unspecified acute conjunctivitis, bilateral: Secondary | ICD-10-CM | POA: Diagnosis not present

## 2018-05-05 NOTE — ED Triage Notes (Signed)
Pt complains of bilateral eye irritation since Friday, he was seen and treated for pink eye with no relief

## 2018-05-06 MED ORDER — ARTIFICIAL TEARS OPHTHALMIC OINT
TOPICAL_OINTMENT | Freq: Once | OPHTHALMIC | Status: AC
  Administered 2018-05-06: 04:00:00 via OPHTHALMIC
  Filled 2018-05-06: qty 3.5

## 2018-05-06 NOTE — ED Provider Notes (Signed)
[facility]-EMERGENCY DEPT Provider Note   CSN: [id number] Arrival date & time: 05/05/18  2234     History   Chief Complaint Chief Complaint  Patient presents with  . eye irritation    HPI Colin Adkins is a 37 y.o. male.  HPI   Colin Fewravis A Skarda is a 37 y.o. male, patient with no pertinent past medical history, presenting to the ED with eye irritation for the last 4-5 days. Woke up with irritation, redness, and clear drainage from the left eye, progressed to include right eye. Was seen at urgent care on 9/15, prescribed gentamycin drops, voices compliance.  Denies vision loss, fever, facial swelling, trauma, or any other complaints.  History reviewed. No pertinent past medical history.  There are no active problems to display for this patient.   Past Surgical History:  Procedure Laterality Date  . FACIAL COSMETIC SURGERY          Home Medications    Prior to Admission medications   Medication Sig Start Date End Date Taking? Authorizing Provider  benzonatate (TESSALON) 100 MG capsule Take 1-2 capsules (100-200 mg total) by mouth 3 (three) times daily as needed for cough. 06/27/15   Brewington, Tishira R, PA-C  doxycycline (VIBRAMYCIN) 100 MG capsule Take 1 capsule (100 mg total) by mouth 2 (two) times daily. Patient not taking: Reported on 06/27/2015 04/07/13   Gilda CreasePollina, Christopher J, MD  HYDROcodone-acetaminophen (NORCO/VICODIN) 5-325 MG per tablet Take 2 tablets by mouth every 4 (four) hours as needed for pain. Patient not taking: Reported on 06/27/2015 04/07/13   Gilda CreasePollina, Christopher J, MD  HYDROcodone-acetaminophen (NORCO/VICODIN) 5-325 MG per tablet Take 1-2 tablets by mouth every 4 (four) hours as needed. Patient not taking: Reported on 06/27/2015 10/20/13   Teressa LowerPickering, Vrinda, NP  ipratropium (ATROVENT) 0.03 % nasal spray Place 2 sprays into both nostrils 2 (two) times daily. 06/27/15   Brewington, Tishira R, PA-C  loratadine (CLARITIN) 10 MG tablet Take  10 mg by mouth as needed for allergies (allergies).    [provider]  predniSONE (DELTASONE) 20 MG tablet Take 40 mg by mouth daily for 3 days, then 20mg  by mouth daily for 3 days, then 10mg  daily for 3 days 04/10/18   Garlon HatchetSanders, Lisa M, PA-C    Family History History reviewed. No pertinent family history.  Social History Social History   Tobacco Use  . Smoking status: Current Every Day Smoker    Types: Cigarettes  . Smokeless tobacco: Never Used  Substance Use Topics  . Alcohol use: Yes  . Drug use: No     Allergies   Patient has no known allergies.   Review of Systems Review of Systems  Constitutional: Negative for fever.  HENT: Negative for facial swelling.   Eyes: Positive for photophobia, pain, discharge, redness and itching. Negative for visual disturbance.     Physical Exam Updated Vital Signs BP 123/73 (BP Location: Right Arm)   Pulse 66   Temp 99 F (37.2 C) (Oral)   Resp 16   Ht 5\' 11"  (1.803 m)   Wt 99.8 kg   SpO2 99%   BMI 30.68 kg/m   Physical Exam  Constitutional: He appears well-developed and well-nourished. No distress.  HENT:  Head: Normocephalic and atraumatic.  Eyes: Pupils are equal, round, and reactive to light. EOM are normal. Right conjunctiva is injected. Left conjunctiva is injected.  Conjunctival injection bilaterally.  Evidence of minimal drainage noted to the lower eyelids.  No pain with EOMs.  No evidence of iritis or globe damage.  Neck: Neck supple.  Cardiovascular: Normal rate and regular rhythm.  Pulmonary/Chest: Effort normal.  Neurological: He is alert.  Skin: Skin is warm and dry. He is not diaphoretic. No pallor.  Psychiatric: He has a normal mood and affect. His behavior is normal.  Nursing note and vitals reviewed.    ED Treatments / Results  Labs (all labs ordered are listed, but only abnormal results are displayed) Labs Reviewed - No data to display  EKG None  Radiology No results  found.  Procedures Procedures (including critical care time)  Medications Ordered in ED Medications  artificial tears (LACRILUBE) ophthalmic ointment (has no administration in time range)     Initial Impression / Assessment and Plan / ED Course  I have reviewed the triage vital signs and the nursing notes.  Pertinent labs & imaging results that were available during my care of the patient were reviewed by me and considered in my medical decision making (see chart for details).     Patient presents with bilateral eye irritation and discharge.  Suspect conjunctivitis, most likely to be viral.  This would explain the lack of response to antibiotics.  He will follow-up with ophthalmology. The patient was given instructions for home care as well as return precautions. Patient voices understanding of these instructions, accepts the plan, and is comfortable with discharge.  Final Clinical Impressions(s) / ED Diagnoses   Final diagnoses:  Acute conjunctivitis of both eyes, unspecified acute conjunctivitis type    ED Discharge Orders    None       Concepcion Living 05/06/18 0342    Molpus, Jonny Ruiz, MD 05/06/18 [id number]

## 2018-05-06 NOTE — ED Notes (Addendum)
Patient states he started antibiotic drops Sunday but his condition has since worsened. Patient requests all lights to be off due to light sensitivity.

## 2021-07-12 ENCOUNTER — Other Ambulatory Visit: Payer: Self-pay

## 2021-07-12 ENCOUNTER — Encounter (HOSPITAL_COMMUNITY): Payer: Self-pay | Admitting: Emergency Medicine

## 2021-07-12 ENCOUNTER — Emergency Department (HOSPITAL_COMMUNITY)
Admission: EM | Admit: 2021-07-12 | Discharge: 2021-07-12 | Disposition: A | Discharge status: Home / Self Care | Payer: 59 | Attending: Emergency Medicine | Admitting: Emergency Medicine

## 2021-07-12 DIAGNOSIS — T402X1A Poisoning by other opioids, accidental (unintentional), initial encounter: Secondary | ICD-10-CM | POA: Insufficient documentation

## 2021-07-12 DIAGNOSIS — F17210 Nicotine dependence, cigarettes, uncomplicated: Secondary | ICD-10-CM | POA: Diagnosis not present

## 2021-07-12 DIAGNOSIS — R000 Tachycardia, unspecified: Secondary | ICD-10-CM | POA: Diagnosis not present

## 2021-07-12 DIAGNOSIS — T40601A Poisoning by unspecified narcotics, accidental (unintentional), initial encounter: Secondary | ICD-10-CM

## 2021-07-12 NOTE — ED Triage Notes (Signed)
Patient BIB [facility], patient was found in a parking lot at a hotel unresponsive. Per EMS supported ventilations x20 minutes. Pt responsive upon arrival to hospital. Pt received 4 mg narcan via EMS. Pt endorses taking one xanax this morning. VSS.

## 2021-07-12 NOTE — ED Notes (Signed)
Pt requesting to leave, EDP made aware, explained risks of leaving to pt, pt still wanting to leave. AMA form signed, pt ambulatory out of department

## 2021-07-12 NOTE — ED Provider Notes (Signed)
[facility] EMERGENCY DEPARTMENT Provider Note   CSN: [id number] Arrival date & time: 07/12/21  [id number]     History Chief Complaint  Patient presents with   Drug Overdose    Colin Adkins is a 40 y.o. male.  40 yo M with a chief complaint of being unresponsive.  Was found by EMS with pinpoint pupils and was apneic and cyanotic.  Given 2 boluses of Narcan and was bagged and had return of respiratory effort and then consciousness in route.  States that he took 1 pill.  He knows that it was the Xanax.  Denies any coingestants other than alcohol last night.  He is an everyday smoker.  Denies any opiates in particular.  Last thing he remembers is trying to get an [place] Popper to take him home.  Denies any chest pain trouble breathing headache neck pain abdominal discomfort.  The history is provided by the patient and the EMS personnel.  Drug Overdose This is a new problem. The current episode started less than 1 hour ago. The problem occurs constantly. The problem has not changed since onset.Pertinent negatives include no chest pain, no abdominal pain, no headaches and no shortness of breath. Nothing aggravates the symptoms. Nothing relieves the symptoms. He has tried nothing for the symptoms. The treatment provided no relief.      History reviewed. No pertinent past medical history.  There are no problems to display for this patient.   Past Surgical History:  Procedure Laterality Date   FACIAL COSMETIC SURGERY         No family history on file.  Social History   Tobacco Use   Smoking status: Every Day    Types: Cigarettes   Smokeless tobacco: Never  Substance Use Topics   Alcohol use: Yes   Drug use: No    Home Medications Prior to Admission medications   Medication Sig Start Date End Date Taking? Authorizing Provider  benzonatate (TESSALON) 100 MG capsule Take 1-2 capsules (100-200 mg total) by mouth 3 (three) times daily as needed for cough. 06/27/15    Brewington, Tishira R, PA-C  doxycycline (VIBRAMYCIN) 100 MG capsule Take 1 capsule (100 mg total) by mouth 2 (two) times daily. Patient not taking: Reported on 06/27/2015 04/07/13   Orpah Greek, MD  HYDROcodone-acetaminophen (NORCO/VICODIN) 5-325 MG per tablet Take 2 tablets by mouth every 4 (four) hours as needed for pain. Patient not taking: Reported on 06/27/2015 04/07/13   Orpah Greek, MD  HYDROcodone-acetaminophen (NORCO/VICODIN) 5-325 MG per tablet Take 1-2 tablets by mouth every 4 (four) hours as needed. Patient not taking: Reported on 06/27/2015 10/20/13   Glendell Docker, NP  ipratropium (ATROVENT) 0.03 % nasal spray Place 2 sprays into both nostrils 2 (two) times daily. 06/27/15   Brewington, Tishira R, PA-C  loratadine (CLARITIN) 10 MG tablet Take 10 mg by mouth as needed for allergies (allergies).    [provider]  predniSONE (DELTASONE) 20 MG tablet Take 40 mg by mouth daily for 3 days, then 20mg  by mouth daily for 3 days, then 10mg  daily for 3 days 04/10/18   Larene Pickett, PA-C    Allergies    Patient has no known allergies.  Review of Systems   Review of Systems  Constitutional:  Negative for chills and fever.  HENT:  Negative for congestion and facial swelling.   Eyes:  Negative for discharge and visual disturbance.  Respiratory:  Negative for shortness of breath.   Cardiovascular:  Negative  for chest pain and palpitations.  Gastrointestinal:  Negative for abdominal pain, diarrhea and vomiting.  Musculoskeletal:  Negative for arthralgias and myalgias.  Skin:  Negative for color change and rash.  Neurological:  Negative for tremors, syncope and headaches.  Psychiatric/Behavioral:  Negative for confusion and dysphoric mood.    Physical Exam Updated Vital Signs Pulse (!) 113   Temp (!) 97.5 F (36.4 C) (Oral)   Resp (!) 23   SpO2 92%   Physical Exam Vitals and nursing note reviewed.  Constitutional:      Appearance: He is  well-developed.  HENT:     Head: Normocephalic and atraumatic.  Eyes:     Pupils: Pupils are equal, round, and reactive to light.  Neck:     Vascular: No JVD.  Cardiovascular:     Rate and Rhythm: Regular rhythm. Tachycardia present.     Heart sounds: No murmur heard.   No friction rub. No gallop.  Pulmonary:     Effort: No respiratory distress.     Breath sounds: No wheezing.  Abdominal:     General: There is no distension.     Tenderness: There is no abdominal tenderness. There is no guarding or rebound.  Musculoskeletal:        General: Normal range of motion.     Cervical back: Normal range of motion and neck supple.  Skin:    Coloration: Skin is not pale.     Findings: No rash.  Neurological:     Mental Status: He is alert and oriented to person, place, and time.  Psychiatric:        Behavior: Behavior normal.    ED Results / Procedures / Treatments   Labs (all labs ordered are listed, but only abnormal results are displayed) Labs Reviewed  CBC WITH DIFFERENTIAL/PLATELET  BASIC METABOLIC PANEL  CBG MONITORING, ED    EKG EKG Interpretation  Date/Time:  Thursday July 12 2021 09:16:47 EST Ventricular Rate:  128 PR Interval:  139 QRS Duration: 76 QT Interval:  323 QTC Calculation: 472 R Axis:   67 Text Interpretation: Sinus tachycardia Paired ventricular premature complexes No old tracing to compare Confirmed by Deno Etienne [phone]) on 07/12/2021 9:24:56 AM  Radiology No results found.  Procedures Procedures  Discussed smoking cessation with patient and was they were offerred resources to help stop.  Total time was 5 min CPT code 99406.   Medications Ordered in ED Medications - No data to display  ED Course  I have reviewed the triage vital signs and the nursing notes.  Pertinent labs & imaging results that were available during my care of the patient were reviewed by me and considered in my medical decision making (see chart for details).    MDM  Rules/Calculators/A&P                           40 yo M with a chief complaints of unresponsiveness.  This occurred while he was waiting for for an [place].  EMS found to be apneic and cyanotic was given 2 boluses of Narcan with improvement of respiratory effort and consciousness.  Patient denies any opiates.  Will observe in the ED for about an hour.  Patient would prefer to go now.  I had discussed with him the rationale for being observed.  We will let him leave [place].  Encouraged to return at any time that he changes his mind.  Consult peers support placed.  9:36 AM:  I have discussed the diagnosis/risks/treatment options with the patient and believe the pt to be eligible for discharge home to follow-up with PCP. We also discussed returning to the ED immediately if new or worsening sx occur. We discussed the sx which are most concerning (e.g., sudden worsening pain, fever, inability to tolerate by mouth) that necessitate immediate return. Medications administered to the patient during their visit and any new prescriptions provided to the patient are listed below.  Medications given during this visit Medications - No data to display   The patient appears reasonably screen and/or stabilized for discharge and I doubt any other medical condition or other [facility] requiring further screening, evaluation, or treatment in the ED at this time prior to discharge.   Final Clinical Impression(s) / ED Diagnoses Final diagnoses:  Opiate overdose, accidental or unintentional, initial encounter [facility])    Rx / DC Orders ED Discharge Orders     None        Melene Plan, DO 07/12/21 [phone]

## 2021-07-12 NOTE — Discharge Instructions (Addendum)
There is help if you need it.  Please do not use dirty needles, this could cause you a severe infection to your skin, heart or spinal cord.  This could kill you or leave you permanently disabled.  There was a recent study done at [facility] that showed that the risk of death for someone that had unintentionally overdosed on narcotics was as high as 15% in the next year.  This is much higher than most every other medical condition.  [place] [facility] the Opioid Problem ([facility]) Fixed; mobile; peer-based Chase Holleman [phone] [email or web address] Fixed site exchange at [employer], Wednesdays (2-5pm) and Thursdays (4-8pm). [address] [place], [place] [address] Call or text to arrange mobile and peer exchange, Mondays (1-4pm) and Fridays (4-7pm). Serving [place] [email or web address]  Suboxone clinic: [facility] [address] [place], [address]  [facility] [address] [place], [address]  [facility] [address] [place] [address]  

## 2022-10-02 ENCOUNTER — Ambulatory Visit (INDEPENDENT_AMBULATORY_CARE_PROVIDER_SITE_OTHER): Payer: 59 | Admitting: Adult Health

## 2022-10-02 ENCOUNTER — Encounter: Payer: Self-pay | Admitting: Adult Health

## 2022-10-02 VITALS — BP 106/80 | HR 71 | Temp 97.7°F | Ht 71.0 in | Wt 251.4 lb

## 2022-10-02 DIAGNOSIS — R0683 Snoring: Secondary | ICD-10-CM

## 2022-10-02 NOTE — Progress Notes (Signed)
Had first 2 covid vaccines, thinks it may have been maderna.

## 2022-10-02 NOTE — Patient Instructions (Signed)
Set up for home sleep study  Healthy sleep regimen  Do not drive if sleepy  Work on healthy weight Follow up in 6-8 weeks to discuss sleep study results and treatment plan

## 2022-10-02 NOTE — Progress Notes (Unsigned)
$@PatientI$  ID: Colin Adkins, male    DOB: 04-05-1981, 42 y.o.   MRN: [id number]  Chief Complaint  Patient presents with   Consult    Referring provider: Karleen Hampshire., MD  HPI: 42 year old male seen for sleep consult October 02, 2022 for snoring, daytime sleepiness, gasping for air during sleep  TEST/EVENTS :   10/02/2022 Sleep consult  Patient presents for a sleep consult today.  Kindly referred by Leticia Clas , PA .  Patient complains of longstanding loud snoring and daytime sleepiness.  His snoring is very disruptive to his sleep and his spouses sleep.  Patient says he has episodes where he gasp for air and chokes during the night.  Feels tired all the time.  No matter how much sleep he gets he always wakes up tired.  Feels sleepy during the daytime.  Typically goes to sleep about midnight to 1 AM.  Takes about an hour to go to sleep.  Is up several times throughout the night.  Gets up about 9 AM.  Weight is up about 20 pounds over the last 2 years.  Current weight is at with a BMI of 35.  Has had no previous sleep study.  Has no history of congestive heart failure or stroke.  Does not use any sleep aids.  Does not take any naps.  Has no removable dental work.  No symptoms suspicious for cataplexy or sleep paralysis.  Epworth score is 2 out of 24.  Typically gets sleepy if he sits down to watch TV and in the evening hours.  Medical history is significant for hypertension.  Surgical history is none.  Social history.  Patient is a active smoker.  Smokes 1/2 pack daily for the last 25 years.  Social alcohol.  No drug use.  Patient is married.  Lives with his wife at home.  Is self-employed.  Owns his own [employer].  Has 4 children.  Family history positive for coronary artery disease.  No Known Allergies   There is no immunization history on file for this patient.  History reviewed. No pertinent past medical history.  Tobacco History: Social History    Tobacco Use  Smoking Status Every Day   Packs/day: 1.00   Years: 25.00   Total pack years: 25.00   Types: Cigarettes  Smokeless Tobacco Never  Tobacco Comments   Smoking <1/2 ppd, bad days 1/2 ppd.  Trying to quit.  10/02/2022 hfb   Ready to quit: Not Answered Counseling given: Not Answered Tobacco comments: Smoking <1/2 ppd, bad days 1/2 ppd.  Trying to quit.  10/02/2022 hfb   Outpatient Medications Prior to Visit  Medication Sig Dispense Refill   buPROPion (WELLBUTRIN XL) 150 MG 24 hr tablet Take 150 mg by mouth daily.     lisinopril (ZESTRIL) 10 MG tablet Take 10 mg by mouth daily.     benzonatate (TESSALON) 100 MG capsule Take 1-2 capsules (100-200 mg total) by mouth 3 (three) times daily as needed for cough. (Patient not taking: Reported on 10/02/2022) 40 capsule 0   doxycycline (VIBRAMYCIN) 100 MG capsule Take 1 capsule (100 mg total) by mouth 2 (two) times daily. (Patient not taking: Reported on 06/27/2015) 20 capsule 0   HYDROcodone-acetaminophen (NORCO/VICODIN) 5-325 MG per tablet Take 2 tablets by mouth every 4 (four) hours as needed for pain. (Patient not taking: Reported on 06/27/2015) 10 tablet 0   HYDROcodone-acetaminophen (NORCO/VICODIN) 5-325 MG per tablet Take 1-2 tablets by mouth every 4 (four) hours  as needed. (Patient not taking: Reported on 06/27/2015) 10 tablet 0   ipratropium (ATROVENT) 0.03 % nasal spray Place 2 sprays into both nostrils 2 (two) times daily. (Patient not taking: Reported on 10/02/2022) 30 mL 0   loratadine (CLARITIN) 10 MG tablet Take 10 mg by mouth as needed for allergies (allergies). (Patient not taking: Reported on 10/02/2022)     predniSONE (DELTASONE) 20 MG tablet Take 40 mg by mouth daily for 3 days, then 80m by mouth daily for 3 days, then 155mdaily for 3 days (Patient not taking: Reported on 10/02/2022) 12 tablet 0   No facility-administered medications prior to visit.     Review of Systems:   Constitutional:   No  weight loss, night  sweats,  Fevers, chills, fatigue, or  lassitude.  HEENT:   No headaches,  Difficulty swallowing,  Tooth/dental problems, or  Sore throat,                No sneezing, itching, ear ache, nasal congestion, post nasal drip,   CV:  No chest pain,  Orthopnea, PND, swelling in lower extremities, anasarca, dizziness, palpitations, syncope.   GI  No heartburn, indigestion, abdominal pain, nausea, vomiting, diarrhea, change in bowel habits, loss of appetite, bloody stools.   Resp: No shortness of breath with exertion or at rest.  No excess mucus, no productive cough,  No non-productive cough,  No coughing up of blood.  No change in color of mucus.  No wheezing.  No chest wall deformity  Skin: no rash or lesions.  GU: no dysuria, change in color of urine, no urgency or frequency.  No flank pain, no hematuria   MS:  No joint pain or swelling.  No decreased range of motion.  No back pain.    Physical Exam  BP 106/80 (BP Location: Left Arm, Patient Position: Sitting, Cuff Size: Large)   Pulse 71   Temp 97.7 F (36.5 C) (Oral)   Ht 5' 11"$  (1.803 m)   Wt 251 lb 6.4 oz (114 kg)   SpO2 94%   BMI 35.06 kg/m   GEN: A/Ox3; pleasant , NAD, well nourished    HEENT:  [place]/AT,  , NOSE-clear, THROAT-clear, no lesions, no postnasal drip or exudate noted.  Class III MP airway.  NECK:  Supple w/ fair ROM; no JVD; normal carotid impulses w/o bruits; no thyromegaly or nodules palpated; no lymphadenopathy.    RESP  Clear  P & A; w/o, wheezes/ rales/ or rhonchi. no accessory muscle use, no dullness to percussion  CARD:  RRR, no m/r/g, no peripheral edema, pulses intact, no cyanosis or clubbing.  GI:   Soft & nt; nml bowel sounds; no organomegaly or masses detected.   Musco: Warm bil, no deformities or joint swelling noted.   Neuro: alert, no focal deficits noted.    Skin: Warm, no lesions or rashes    Lab Results:  CBC  No results found for: "NA", "K", "CL", "CO2", "GLUCOSE", "BUN",  "CREATININE", "CALCIUM", "GFRNONAA", "GFRAA"  BNP No results found for: "BNP"  ProBNP No results found for: "PROBNP"  Imaging: No results found.        No data to display          No results found for: "NITRICOXIDE"      Assessment & Plan:   No problem-specific Assessment & Plan notes found for this encounter.     TaRexene EdisonNP 10/02/2022

## 2022-10-03 DIAGNOSIS — R0683 Snoring: Secondary | ICD-10-CM | POA: Insufficient documentation

## 2022-10-03 NOTE — Assessment & Plan Note (Signed)
Loud snoring, daytime sleepiness, restless sleep, gasping for air during the nighttime and daytime fatigue all suspicious for underlying sleep apnea.  Will set patient up for a home sleep study.  Patient education on sleep apnea given.  - discussed how weight can impact sleep and risk for sleep disordered breathing - discussed options to assist with weight loss: combination of diet modification, cardiovascular and strength training exercises   - had an extensive discussion regarding the adverse health consequences related to untreated sleep disordered breathing - specifically discussed the risks for hypertension, coronary artery disease, cardiac dysrhythmias, cerebrovascular disease, and diabetes - lifestyle modification discussed   - discussed how sleep disruption can increase risk of accidents, particularly when driving - safe driving practices were discussed   Plan  Patient Instructions  Set up for home sleep study  Healthy sleep regimen  Do not drive if sleepy  Work on healthy weight Follow up in 6-8 weeks to discuss sleep study results and treatment plan

## 2022-10-03 NOTE — Assessment & Plan Note (Signed)
Healthy weight loss discussed 

## 2022-10-04 NOTE — Progress Notes (Signed)
Reviewed and agree with assessment/plan.   Chesley Mires, MD [facility] Pulmonary/Critical Care 10/04/2022, 10:37 AM Pager:  [phone]

## 2022-11-19 ENCOUNTER — Ambulatory Visit (INDEPENDENT_AMBULATORY_CARE_PROVIDER_SITE_OTHER): Payer: 59

## 2022-11-19 DIAGNOSIS — R0683 Snoring: Secondary | ICD-10-CM

## 2022-11-19 DIAGNOSIS — G4733 Obstructive sleep apnea (adult) (pediatric): Secondary | ICD-10-CM | POA: Diagnosis not present

## 2022-11-21 ENCOUNTER — Telehealth: Payer: Self-pay | Admitting: *Deleted

## 2022-11-21 NOTE — Telephone Encounter (Signed)
Pt returned call. States he just did the sleep study a couple nights ago. Have rescheduled pt's appt. Nothing further needed.

## 2022-11-21 NOTE — Telephone Encounter (Signed)
ATC patient, left detailed message regarding OV for 4/5 as we do not have results for HST and trying to find out if he had the HST done.  Advised if he has not had the HST done, we need to have him schedule his f/u 2 weeks out after he has the test done.  Will await return call.

## 2022-11-22 ENCOUNTER — Ambulatory Visit: Payer: 59 | Admitting: Adult Health

## 2022-12-03 ENCOUNTER — Telehealth: Payer: Self-pay | Admitting: *Deleted

## 2022-12-03 NOTE — Telephone Encounter (Signed)
Patient states he had his sleep test done.  Do we have the sleep study back and read?  He is scheduled for f/u tomorrow.

## 2022-12-04 ENCOUNTER — Encounter: Payer: Self-pay | Admitting: Adult Health

## 2022-12-04 ENCOUNTER — Ambulatory Visit (INDEPENDENT_AMBULATORY_CARE_PROVIDER_SITE_OTHER): Payer: 59 | Admitting: Adult Health

## 2022-12-04 ENCOUNTER — Telehealth: Payer: Self-pay | Admitting: *Deleted

## 2022-12-04 VITALS — BP 126/90 | HR 79 | Ht 71.0 in | Wt 243.8 lb

## 2022-12-04 DIAGNOSIS — G4733 Obstructive sleep apnea (adult) (pediatric): Secondary | ICD-10-CM | POA: Diagnosis not present

## 2022-12-04 NOTE — Telephone Encounter (Signed)
AVS and information about oral appliances mailed to patient as he left the exam room before I returned with his discharge paperwork.  Nothing further needed.

## 2022-12-04 NOTE — Patient Instructions (Addendum)
Refer to Orthodontics Dr. Myrtis Ser for oral appliance for sleep apnea. [phone]) Healthy sleep regimen  Do not drive if sleepy  Work on healthy weight Follow up in 6 months and As needed

## 2022-12-04 NOTE — Telephone Encounter (Signed)
Received.  Patient seen today.  Nothing further needed.

## 2022-12-04 NOTE — Progress Notes (Signed)
Reviewed and agree with assessment/plan.   Coralyn Helling, MD [facility] Pulmonary/Critical Care 12/04/2022, 10:31 AM Pager:  [phone]

## 2022-12-04 NOTE — Progress Notes (Signed)
  ID: Colin Adkins, male    DOB: [birth year], 42 y.o.   MRN: [id number]  Chief Complaint  Patient presents with   Follow-up    Referring provider: Laqueta Due., MD  HPI: 42 year old male seen for sleep consult October 02, 2022 for snoring and daytime sleepiness found to have moderate obstructive sleep apnea  TEST/EVENTS :  November 07, 2022 Home sleep study-snap diagnostic-moderate obstructive sleep apnea, AHI 18.5/hour SpO2 low at 85%  12/04/2022 Follow up ; OSA  Patient presents for a 5-month follow-up.  Patient was seen last visit for sleep consult.  He had snoring and daytime sleepiness.  He was set up for home sleep study that was completed on November 07, 2022 that showed moderate sleep apnea.  AHI was 18.5/hour and SpO2 low was 85%.  We went over his sleep study results in detail patient education was given.  We discussed treatment options including weight loss, oral appliance and CPAP therapy.  Patient would like to proceed with oral appliance.   No Known Allergies   There is no immunization history on file for this patient.  No past medical history on file.  Tobacco History: Social History   Tobacco Use  Smoking Status Every Day   Packs/day: 1.00   Years: 25.00   Additional pack years: 0.00   Total pack years: 25.00   Types: Cigarettes  Smokeless Tobacco Never  Tobacco Comments   Smoking <1/2 ppd, bad days 1/2 ppd.  Trying to quit.  10/02/2022 hfb   Ready to quit: Not Answered Counseling given: Not Answered Tobacco comments: Smoking <1/2 ppd, bad days 1/2 ppd.  Trying to quit.  10/02/2022 hfb   Outpatient Medications Prior to Visit  Medication Sig Dispense Refill   buPROPion (WELLBUTRIN XL) 150 MG 24 hr tablet Take 150 mg by mouth daily.     lisinopril (ZESTRIL) 10 MG tablet Take 10 mg by mouth daily.     No facility-administered medications prior to visit.     Review of Systems:   Constitutional:   No  weight loss, night sweats,  Fevers, chills,  fatigue, or  lassitude.  HEENT:   No headaches,  Difficulty swallowing,  Tooth/dental problems, or  Sore throat,                No sneezing, itching, ear ache, nasal congestion, post nasal drip,   CV:  No chest pain,  Orthopnea, PND, swelling in lower extremities, anasarca, dizziness, palpitations, syncope.   GI  No heartburn, indigestion, abdominal pain, nausea, vomiting, diarrhea, change in bowel habits, loss of appetite, bloody stools.   Resp: No shortness of breath with exertion or at rest.  No excess mucus, no productive cough,  No non-productive cough,  No coughing up of blood.  No change in color of mucus.  No wheezing.  No chest wall deformity  Skin: no rash or lesions.  GU: no dysuria, change in color of urine, no urgency or frequency.  No flank pain, no hematuria   MS:  No joint pain or swelling.  No decreased range of motion.  No back pain.    Physical Exam  BP (!) 126/90 (BP Location: Left Arm, Patient Position: Sitting, Cuff Size: Normal)   Pulse 79   Ht  (1.803 m)   Wt 243 lb 12.8 oz (110.6 kg)   SpO2 98%   BMI 34.00 kg/m   GEN: A/Ox3; pleasant , NAD, well nourished    HEENT:  [place]/AT,  NOSE-clear, THROAT-clear,  no lesions, no postnasal drip or exudate noted.   NECK:  Supple w/ fair ROM; no JVD; normal carotid impulses w/o bruits; no thyromegaly or nodules palpated; no lymphadenopathy.    RESP  Clear  P & A; w/o, wheezes/ rales/ or rhonchi. no accessory muscle use, no dullness to percussion  CARD:  RRR, no m/r/g, no peripheral edema, pulses intact, no cyanosis or clubbing.  GI:   Soft & nt; nml bowel sounds; no organomegaly or masses detected.   Musco: Warm bil, no deformities or joint swelling noted.   Neuro: alert, no focal deficits noted.    Skin: Warm, no lesions or rashes    Lab Results:  CBC   BMET No results found for: "NA", "K", "CL", "CO2", "GLUCOSE", "BUN", "CREATININE", "CALCIUM", "GFRNONAA", "GFRAA"  BNP No results found for:  "BNP"  ProBNP No results found for: "PROBNP"  Imaging: No results found.        No data to display          No results found for: "NITRICOXIDE"      Assessment & Plan:   OSA (obstructive sleep apnea) Moderate obstructive sleep apnea patient education given.  We reviewed his sleep study results in detail.  Went over treatment options including weight loss oral appliance and CPAP therapy.  Patient will proceed with referral for oral appliance. - discussed how weight can impact sleep and risk for sleep disordered breathing - discussed options to assist with weight loss: combination of diet modification, cardiovascular and strength training exercises   - had an extensive discussion regarding the adverse health consequences related to untreated sleep disordered breathing - specifically discussed the risks for hypertension, coronary artery disease, cardiac dysrhythmias, cerebrovascular disease, and diabetes - lifestyle modification discussed   - discussed how sleep disruption can increase risk of accidents, particularly when driving - safe driving practices were discussed   Plan  Patient Instructions  Refer to Orthodontics Dr. Myrtis Ser for oral appliance for sleep apnea. [phone]) Healthy sleep regimen  Do not drive if sleepy  Work on healthy weight Follow up in 6 months and As needed        [name], NP 12/04/2022

## 2022-12-04 NOTE — Progress Notes (Signed)
Had first 2 covid vaccines.

## 2022-12-04 NOTE — Assessment & Plan Note (Signed)
Moderate obstructive sleep apnea patient education given.  We reviewed his sleep study results in detail.  Went over treatment options including weight loss oral appliance and CPAP therapy.  Patient will proceed with referral for oral appliance. - discussed how weight can impact sleep and risk for sleep disordered breathing - discussed options to assist with weight loss: combination of diet modification, cardiovascular and strength training exercises   - had an extensive discussion regarding the adverse health consequences related to untreated sleep disordered breathing - specifically discussed the risks for hypertension, coronary artery disease, cardiac dysrhythmias, cerebrovascular disease, and diabetes - lifestyle modification discussed   - discussed how sleep disruption can increase risk of accidents, particularly when driving - safe driving practices were discussed   Plan  Patient Instructions  Refer to Orthodontics Dr. Myrtis Ser for oral appliance for sleep apnea. [phone]) Healthy sleep regimen  Do not drive if sleepy  Work on healthy weight Follow up in 6 months and As needed

## 2022-12-04 NOTE — Addendum Note (Signed)
Addended by: Delrae Rend on: 12/04/2022 10:22 AM   Modules accepted: Orders
